# Patient Record
Sex: Female | Born: 1990 | Race: White | Hispanic: No | Marital: Married
Health system: Southern US, Community
[De-identification: ages and names within clinical notes are randomized; demographics above are authoritative.]

## PROBLEM LIST (undated history)

## (undated) DIAGNOSIS — I82409 Acute embolism and thrombosis of unspecified deep veins of unspecified lower extremity: Secondary | ICD-10-CM

## (undated) DIAGNOSIS — M1990 Unspecified osteoarthritis, unspecified site: Secondary | ICD-10-CM

## (undated) DIAGNOSIS — T781XXA Other adverse food reactions, not elsewhere classified, initial encounter: Secondary | ICD-10-CM

## (undated) DIAGNOSIS — J45909 Unspecified asthma, uncomplicated: Secondary | ICD-10-CM

## (undated) DIAGNOSIS — L0291 Cutaneous abscess, unspecified: Secondary | ICD-10-CM

---

## 2019-10-15 ENCOUNTER — Emergency Department (HOSPITAL_COMMUNITY)
Admission: EM | Admit: 2019-10-15 | Discharge: 2019-10-16 | Disposition: A | Discharge status: Home / Self Care | Payer: Medicaid - Out of State | Attending: Emergency Medicine | Admitting: Emergency Medicine

## 2019-10-15 ENCOUNTER — Other Ambulatory Visit: Payer: Self-pay

## 2019-10-15 ENCOUNTER — Emergency Department (HOSPITAL_COMMUNITY): Payer: Medicaid - Out of State

## 2019-10-15 ENCOUNTER — Encounter (HOSPITAL_COMMUNITY): Payer: Self-pay | Admitting: Emergency Medicine

## 2019-10-15 DIAGNOSIS — J45909 Unspecified asthma, uncomplicated: Secondary | ICD-10-CM | POA: Diagnosis not present

## 2019-10-15 DIAGNOSIS — N3090 Cystitis, unspecified without hematuria: Secondary | ICD-10-CM

## 2019-10-15 DIAGNOSIS — R1030 Lower abdominal pain, unspecified: Secondary | ICD-10-CM | POA: Diagnosis present

## 2019-10-15 DIAGNOSIS — N3000 Acute cystitis without hematuria: Secondary | ICD-10-CM | POA: Diagnosis not present

## 2019-10-15 DIAGNOSIS — I880 Nonspecific mesenteric lymphadenitis: Secondary | ICD-10-CM | POA: Insufficient documentation

## 2019-10-15 DIAGNOSIS — Z79899 Other long term (current) drug therapy: Secondary | ICD-10-CM | POA: Diagnosis not present

## 2019-10-15 HISTORY — DX: Other adverse food reactions, not elsewhere classified, initial encounter: T78.1XXA

## 2019-10-15 HISTORY — DX: Acute embolism and thrombosis of unspecified deep veins of unspecified lower extremity: I82.409

## 2019-10-15 HISTORY — DX: Unspecified osteoarthritis, unspecified site: M19.90

## 2019-10-15 HISTORY — DX: Cutaneous abscess, unspecified: L02.91

## 2019-10-15 HISTORY — DX: Unspecified asthma, uncomplicated: J45.909

## 2019-10-15 LAB — COMPREHENSIVE METABOLIC PANEL
ALT: 19 U/L (ref 0–44)
AST: 16 U/L (ref 15–41)
Albumin: 3.5 g/dL (ref 3.5–5.0)
Alkaline Phosphatase: 60 U/L (ref 38–126)
Anion gap: 9 (ref 5–15)
BUN: 10 mg/dL (ref 6–20)
CO2: 22 mmol/L (ref 22–32)
Calcium: 8.4 mg/dL — ABNORMAL LOW (ref 8.9–10.3)
Chloride: 107 mmol/L (ref 98–111)
Creatinine, Ser: 0.79 mg/dL (ref 0.44–1.00)
GFR calc Af Amer: >60 mL/min (ref >60)
GFR calc non Af Amer: >60 mL/min (ref >60)
Glucose, Bld: 86 mg/dL (ref 70–99)
Potassium: 3.8 mmol/L (ref 3.5–5.1)
Sodium: 138 mmol/L (ref 135–145)
Total Bilirubin: 0.3 mg/dL (ref 0.3–1.2)
Total Protein: 7.6 g/dL (ref 6.5–8.1)

## 2019-10-15 LAB — URINALYSIS, ROUTINE W REFLEX MICROSCOPIC
Bilirubin Urine: NEGATIVE
Glucose, UA: NEGATIVE mg/dL
Ketones, ur: NEGATIVE mg/dL
Nitrite: NEGATIVE
Protein, ur: 100 mg/dL — AB
Specific Gravity, Urine: 1.03 (ref 1.005–1.030)
pH: 6 (ref 5.0–8.0)

## 2019-10-15 LAB — CBC WITH DIFFERENTIAL/PLATELET
Abs Immature Granulocytes: 0.06 10*3/uL (ref 0.00–0.07)
Basophils Absolute: 0 10*3/uL (ref 0.0–0.1)
Basophils Relative: 0 %
Eosinophils Absolute: 0.1 10*3/uL (ref 0.0–0.5)
Eosinophils Relative: 1 %
HCT: 35.3 % — ABNORMAL LOW (ref 36.0–46.0)
Hemoglobin: 10.9 g/dL — ABNORMAL LOW (ref 12.0–15.0)
Immature Granulocytes: 1 %
Lymphocytes Relative: 27 %
Lymphs Abs: 3.3 10*3/uL (ref 0.7–4.0)
MCH: 27.3 pg (ref 26.0–34.0)
MCHC: 30.9 g/dL (ref 30.0–36.0)
MCV: 88.5 fL (ref 80.0–100.0)
Monocytes Absolute: 0.7 10*3/uL (ref 0.1–1.0)
Monocytes Relative: 6 %
Neutro Abs: 7.8 10*3/uL — ABNORMAL HIGH (ref 1.7–7.7)
Neutrophils Relative %: 65 %
Platelets: 364 10*3/uL (ref 150–400)
RBC: 3.99 MIL/uL (ref 3.87–5.11)
RDW: 13.2 % (ref 11.5–15.5)
WBC: 11.9 10*3/uL — ABNORMAL HIGH (ref 4.0–10.5)
nRBC: 0 % (ref 0.0–0.2)

## 2019-10-15 LAB — PREGNANCY, URINE: Preg Test, Ur: NEGATIVE

## 2019-10-15 LAB — CBG MONITORING, ED: Glucose-Capillary: 90 mg/dL (ref 70–99)

## 2019-10-15 LAB — LIPASE, BLOOD: Lipase: 25 U/L (ref 11–51)

## 2019-10-15 MED ORDER — ONDANSETRON 8 MG PO TBDP
8.0000 mg | ORAL_TABLET | Freq: Once | ORAL | Status: AC
  Administered 2019-10-15: 8 mg via ORAL
  Filled 2019-10-15: qty 1

## 2019-10-15 MED ORDER — KETOROLAC TROMETHAMINE 30 MG/ML IJ SOLN
15.0000 mg | Freq: Once | INTRAMUSCULAR | Status: DC
  Filled 2019-10-15: qty 1

## 2019-10-15 MED ORDER — SODIUM CHLORIDE 0.9 % IV BOLUS: 1000.0000 mL | Freq: Once | INTRAVENOUS | Status: DC

## 2019-10-15 MED ORDER — IBUPROFEN 400 MG PO TABS
600.0000 mg | ORAL_TABLET | Freq: Once | ORAL | Status: AC
  Administered 2019-10-15: 600 mg via ORAL
  Filled 2019-10-15: qty 2

## 2019-10-15 NOTE — ED Provider Notes (Signed)
[facility] EMERGENCY DEPARTMENT Provider Note   CSN: [id number] Arrival date & time: 10/15/19  1931     History   Chief Complaint Chief Complaint  Patient presents with  . Abdominal Pain    back pain    HPI Jessica Rodriguez is a 28 y.o. female.     HPI   Jessica Rodriguez is a 28 y.o. female, with a history of arthritis, asthma, DVT, presenting to the ED with abdominal pain beginning around 5 pm today. Pain is suprapubic, burning, constant, improved since onset, currently 8/10, radiating bilateral lower abdomen. Also has a soreness in the left midback. Began while driving. Accompanied by nausea which has since resolved.  Subjective fever. Preceded by a sense of spasms in her bladder intermittently for several days and has had the same symptoms with UTIs in the past.  Last bowel movement was just prior to arrival.  This did not improve symptoms. Started on clarithromycin two days ago for sinusitis, 500mg  q12 hours for 10 days.  Took ibuprofen around 2 pm today for her sinus pain. Has not taken any other medications since her abdominal pain began.   LMP began around Nov 8. She has a menstrual cycle about every 3 months.  Denies any suspicion for STDs. Denies vomiting, diarrhea, constipation, chest pain, cough, shortness of breath, vaginal discharge/bleeding, dysuria, hematuria, or any other complaints.  Past Medical History:  Diagnosis Date  . Abscess   . Allergic reaction to alpha-gal   . Arthritis   . Asthma   . DVT (deep venous thrombosis) (HCC)     There are no active problems to display for this patient.   History reviewed. No pertinent surgical history.   OB History   No obstetric history on file.      Home Medications    Prior to Admission medications   Medication Sig Start Date End Date Taking? Authorizing Provider  albuterol (PROVENTIL HFA) 108 (90 Base) MCG/ACT inhaler Inhale into the lungs.   Yes [provider]  albuterol (PROVENTIL) (2.5 MG/3ML) 0.083%  nebulizer solution Inhale into the lungs.   Yes [provider]  amLODipine (NORVASC) 2.5 MG tablet Take 2.5 mg by mouth daily. 09/01/19  Yes [provider]  ASHLYNA 0.15-0.03 &0.01 MG tablet Take 1 tablet by mouth daily. 4pm 09/06/19  Yes [provider]  cetirizine (ZYRTEC) 10 MG tablet Take 10 mg by mouth daily. 10/01/19  Yes [provider]  cyanocobalamin (,VITAMIN B-12,) 1000 MCG/ML injection Inject 1,000 mcg into the muscle every 30 (thirty) days.  09/17/19  Yes [provider]  fluticasone (FLONASE) 50 MCG/ACT nasal spray Place 2 sprays into both nostrils daily as needed for allergies or rhinitis.   Yes [provider]  hydrochlorothiazide (HYDRODIURIL) 12.5 MG tablet Take 12.5 mg by mouth daily. 10/03/19  Yes [provider]  HYDROcodone-acetaminophen (NORCO/VICODIN) 5-325 MG tablet Take 1 tablet by mouth every 4 (four) hours as needed for moderate pain or severe pain.  10/01/19  Yes [provider]  ibuprofen (ADVIL) 200 MG tablet Take 800 mg by mouth every 6 (six) hours as needed for fever or moderate pain.   Yes [provider]  omeprazole (PRILOSEC) 40 MG capsule Take 40 mg by mouth every morning. 10/13/19  Yes [provider]  potassium chloride SA (KLOR-CON) 20 MEQ tablet Take 40 mEq by mouth daily. 09/17/19  Yes [provider]  Vitamin D, Ergocalciferol, (DRISDOL) 1.25 MG (50000 UT) CAPS capsule Take 50,000 Units by mouth  every Monday.  06/11/19  Yes [provider]  ciprofloxacin (CIPRO) 500 MG tablet Take 1 tablet (500 mg total) by mouth 2 (two) times daily for 10 days. 10/16/19 10/26/19  Joy, Shawn C, PA-C  EPINEPHrine 0.3 mg/0.3 mL IJ SOAJ injection Inject 0.3 mg into the muscle as needed for anaphylaxis.  06/11/19   [provider]    Family History No family history on file.  Social History Social History   Tobacco Use  . Smoking status: Not on file   Substance Use Topics  . Alcohol use: Not Currently  . Drug use: Never     Allergies   Alpha-gal, Iodinated diagnostic agents, Lactase, Lactose, Nitrofurantoin, Other, Penicillins, and Sulfa antibiotics   Review of Systems Review of Systems  Constitutional: Positive for fever.  Respiratory: Negative for cough and shortness of breath.   Cardiovascular: Negative for chest pain.  Gastrointestinal: Positive for abdominal pain and nausea. Negative for blood in stool, constipation, diarrhea and vomiting.  Genitourinary: Negative for decreased urine volume, difficulty urinating, dysuria, flank pain, hematuria, vaginal bleeding and vaginal discharge.  Musculoskeletal: Positive for back pain.  Neurological: Negative for weakness.  All other systems reviewed and are negative.    Physical Exam Updated Vital Signs BP 123/73 (BP Location: Right Arm)   Pulse (!) 106   Temp 98 F (36.7 C) (Oral)   Resp 20   Ht 5\' 4"  (1.626 m)   Wt 127 kg   LMP 10/06/2019 (Approximate)   SpO2 100%   BMI 48.06 kg/m   Physical Exam Vitals signs and nursing note reviewed.  Constitutional:      General: She is not in acute distress.    Appearance: She is well-developed. She is obese. She is not diaphoretic.     Comments: Patient is actually rather comfortable appearing.  Sitting upright in bed in no apparent distress.  HENT:     Head: Normocephalic and atraumatic.     Mouth/Throat:     Mouth: Mucous membranes are moist.     Pharynx: Oropharynx is clear.  Eyes:     Conjunctiva/sclera: Conjunctivae normal.  Neck:     Musculoskeletal: Neck supple.  Cardiovascular:     Rate and Rhythm: Normal rate and regular rhythm.     Pulses: Normal pulses.          Radial pulses are 2+ on the right side and 2+ on the left side.     Heart sounds: Normal heart sounds.     Comments: No tachycardia during my exam. Pulmonary:     Effort: Pulmonary effort is normal. No respiratory distress.     Breath sounds: Normal  breath sounds.  Abdominal:     Palpations: Abdomen is soft.     Tenderness: There is abdominal tenderness in the suprapubic area. There is no right CVA tenderness or guarding.       Comments: Tenderness is worst in the suprapubic region. There is some tenderness in the left mid back as well which could be interpreted as mild CVA tenderness, however, there is not increased pain with percussion.  Musculoskeletal:     Right lower leg: No edema.     Left lower leg: No edema.  Lymphadenopathy:     Cervical: No cervical adenopathy.  Skin:    General: Skin is warm and dry.  Neurological:     Mental Status: She is alert.  Psychiatric:        Mood and Affect: Mood and affect normal.  Speech: Speech normal.        Behavior: Behavior normal.      ED Treatments / Results  Labs (all labs ordered are listed, but only abnormal results are displayed) Labs Reviewed  URINALYSIS, ROUTINE W REFLEX MICROSCOPIC - Abnormal; Notable for the following components:      Result Value   APPearance CLOUDY (*)    Hgb urine dipstick SMALL (*)    Protein, ur 100 (*)    Leukocytes,Ua SMALL (*)    Bacteria, UA MANY (*)    All other components within normal limits  COMPREHENSIVE METABOLIC PANEL - Abnormal; Notable for the following components:   Calcium 8.4 (*)    All other components within normal limits  CBC WITH DIFFERENTIAL/PLATELET - Abnormal; Notable for the following components:   WBC 11.9 (*)    Hemoglobin 10.9 (*)    HCT 35.3 (*)    Neutro Abs 7.8 (*)    All other components within normal limits  URINE CULTURE  PREGNANCY, URINE  LIPASE, BLOOD  CBG MONITORING, ED    EKG None  Radiology Ct Abdomen Pelvis Wo Contrast  Result Date: 10/16/2019 CLINICAL DATA:  Abdominal pain, burning in her stomach, not upon urination, aching in the back EXAM: CT ABDOMEN AND PELVIS WITHOUT CONTRAST TECHNIQUE: Multidetector CT imaging of the abdomen and pelvis was performed following the standard  protocol without IV contrast. COMPARISON:  None. FINDINGS: Lower chest: Lung bases are clear. Normal heart size. No pericardial effusion. Hepatobiliary: No focal liver abnormality is seen. No gallstones, gallbladder wall thickening, or biliary dilatation. Pancreas: Unremarkable. No pancreatic ductal dilatation or surrounding inflammatory changes. Spleen: Normal in size without focal abnormality. Adrenals/Urinary Tract: Normal adrenal glands. No visible or contour deforming renal masses. Hyperattenuation of the renal medullary tips, a nonspecific finding but can be seen with higher urine specific gravity/urine concentration. No hydronephrosis. No visible urolithiasis. Urinary bladder is largely decompressed at the time of exam and therefore poorly evaluated by CT imaging. Stomach/Bowel: Distal esophagus, stomach and duodenal sweep are unremarkable. No small bowel wall thickening or dilatation. A normal appendix is visualized. No colonic dilatation or wall thickening. Enteric contrast media traverses to the distal descending colon. No evidence of obstruction. Vascular/Lymphatic: The aorta is normal caliber. There is a focal cluster reactive appearing mesenteric lymph nodes in the left upper quadrant (2/44), as well as a similar cluster in the right lower quadrant (2/54). Reproductive: Normal uterus. No concerning adnexal lesions. Other: No abdominopelvic free fluid or free gas. No bowel containing hernias. Mild body wall edema. Musculoskeletal: No acute osseous abnormality or suspicious osseous lesion. IMPRESSION: 1. Focal cluster reactive appearing mesenteric lymph nodes in the left upper quadrant and right lower quadrant, suggestive of mesenteric adenitis. 2. Hyperattenuation of the renal medullary tips, a nonspecific finding but can be seen with higher urine specific gravity/urine concentration. 3. No other acute intra-abdominal process. Electronically Signed   By: Kreg Shropshire M.D.   On: 10/16/2019 00:29     Procedures Procedures (including critical care time)  Medications Ordered in ED Medications  ibuprofen (ADVIL) tablet 600 mg (600 mg Oral Given 10/15/19 2231)  ondansetron (ZOFRAN-ODT) disintegrating tablet 8 mg (8 mg Oral Given 10/15/19 2246)  ciprofloxacin (CIPRO) tablet 500 mg (500 mg Oral Given 10/16/19 0207)     Initial Impression / Assessment and Plan / ED Course  I have reviewed the triage vital signs and the nursing notes.  Pertinent labs & imaging results that were available during my care of the  patient were reviewed by me and considered in my medical decision making (see chart for details).  Clinical Course as of Oct 16 914  Wed Oct 15, 2019  2149 IV attempts have failed.  Patient is refused further IV attempts.  States she would prefer p.o. medications.   [SJ]  Thu Oct 16, 2019  0137 Spoke with Santiago Glad, pharmacist.  States patient can stop her clarithromycin and only take the cipro, as the Cipro will provide coverage for sinusitis as well as cystitis or pyelonephritis.   [SJ]    Clinical Course User Index [SJ] Joy, Shawn C, PA-C       Patient presents with abdominal pain beginning today. Patient is nontoxic appearing, afebrile, not tachycardic on my exam, not tachypneic, not hypotensive, maintains excellent SPO2 on room air, and is in no apparent distress.  Discussed pelvic exam, however, patient states she has a good relationship with her OB/GYN and can have this performed in their office, if needed. Mild leukocytosis. CT with evidence of mesenteric adenitis. Due to the patient stating that some of her symptoms today have previously been associated with UTI, cystitis versus pyelonephritis (due to the addition of back pain, leukocytosis, and subjective fever) was entertained as a diagnosis and treatment was provided.  Urine culture pending. The patient was given instructions for home care as well as return precautions. Patient voices understanding of these  instructions, accepts the plan, and is comfortable with discharge.   Vitals:   10/15/19 1954 10/15/19 1955 10/16/19 0225  BP: 123/73  (!) 104/56  Pulse: (!) 106  95  Resp: 20  20  Temp: 98 F (36.7 C)    TempSrc: Oral    SpO2: 100%  99%  Weight:  127 kg   Height:  5\' 4"  (1.626 m)     Final Clinical Impressions(s) / ED Diagnoses   Final diagnoses:  Mesenteric adenitis  Cystitis    ED Discharge Orders         Ordered    ciprofloxacin (CIPRO) 500 MG tablet  2 times daily     10/16/19 0118           Lorayne Bender, PA-C 10/17/19 9379    Ezequiel Essex, MD 10/17/19 0930

## 2019-10-15 NOTE — ED Notes (Signed)
Patient is currently taking Clarithromycin for a sinus infection.

## 2019-10-15 NOTE — ED Triage Notes (Signed)
Patient complaining of abdominal pain. Patient states burning in her stomach but not upon urination. Patient states dull aching pain in her back.

## 2019-10-16 MED ORDER — CIPROFLOXACIN HCL 250 MG PO TABS
500.0000 mg | ORAL_TABLET | Freq: Once | ORAL | Status: AC
  Administered 2019-10-16: 02:00:00 500 mg via ORAL
  Filled 2019-10-16: qty 2

## 2019-10-16 MED ORDER — CIPROFLOXACIN HCL 500 MG PO TABS: 500.0000 mg | ORAL_TABLET | Freq: Two times a day (BID) | ORAL | 0 refills | Status: AC

## 2019-10-16 NOTE — Discharge Instructions (Addendum)
Antiinflammatory medications: Take 600 mg of ibuprofen every 6 hours or 440 mg (over the counter dose) to 500 mg (prescription dose) of naproxen every 12 hours for the next 3 days. After this time, these medications may be used as needed for pain. Take these medications with food to avoid upset stomach. Choose only one of these medications, do not take them together. Acetaminophen (generic for Tylenol): Should you continue to have additional pain while taking the ibuprofen or naproxen, you may add in acetaminophen as needed. Your daily total maximum amount of acetaminophen from all sources should be limited to 4000mg /day for persons without liver problems, or 2000mg /day for those with liver problems.  You may stop taking the clarithromycin and begin taking the ciprofloxacin.  This will cover sinus infection and UTI.  Please take all of your antibiotics until finished!   You may develop abdominal discomfort or diarrhea from the antibiotic.  You may help offset this with probiotics which you can buy or get in yogurt. Do not eat or take the probiotics until 2 hours after your antibiotic.   Follow-up with your primary care provider for any further management of this issue.  It is also advisable for you to inform your surgeon of the mesenteric adenitis diagnosis.

## 2019-10-17 LAB — URINE CULTURE

## 2020-04-15 IMAGING — CT CT ABD-PELV W/O CM
2 of 4 series · 16 of 46 positions shown, 18 images · non-contrast
Comparison: None.

CLINICAL DATA: Abdominal pain, burning in her stomach, not upon
urination, aching in the back

EXAM:
CT ABDOMEN AND PELVIS WITHOUT CONTRAST
TECHNIQUE: Multidetector CT imaging of the abdomen and pelvis was performed
following the standard protocol without IV contrast.

[Series 2: axial st · axial · 0.98mm/px · z∈[-15,+390]mm · 13 of 93 slices shown, 15 images]
[im 6/93  soft-tissue]
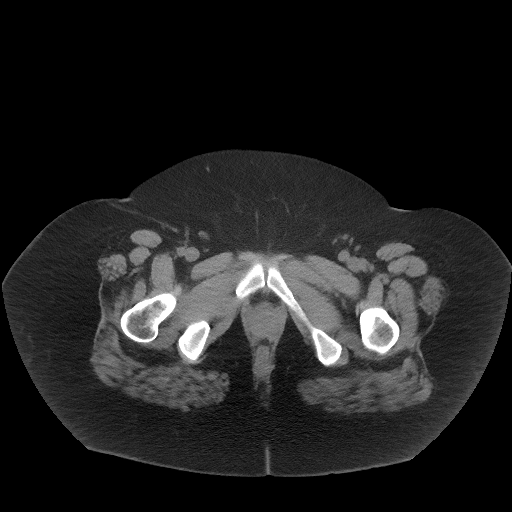
[im 6/93  bone]
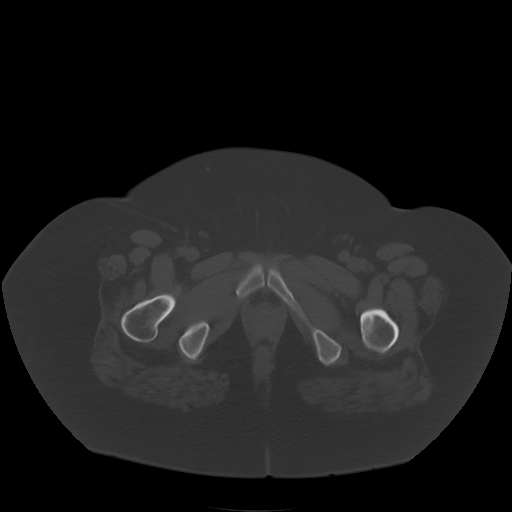
[im 11/93  soft-tissue]
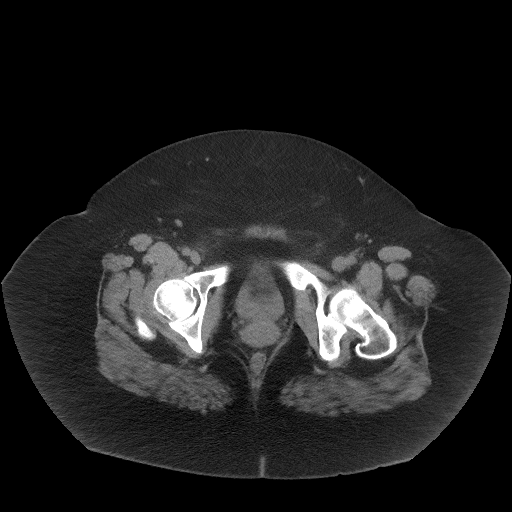
[im 22/93  soft-tissue]
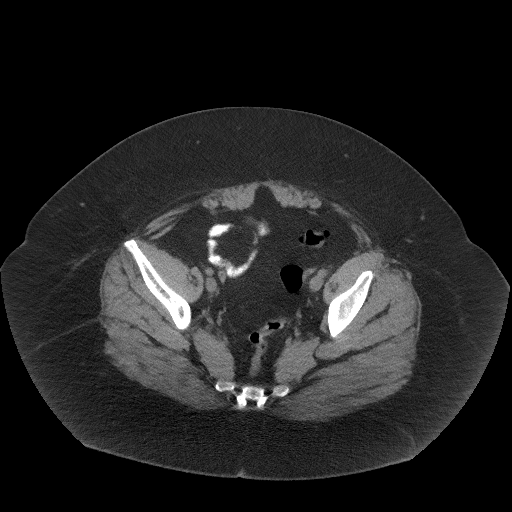
[im 28/93  soft-tissue]
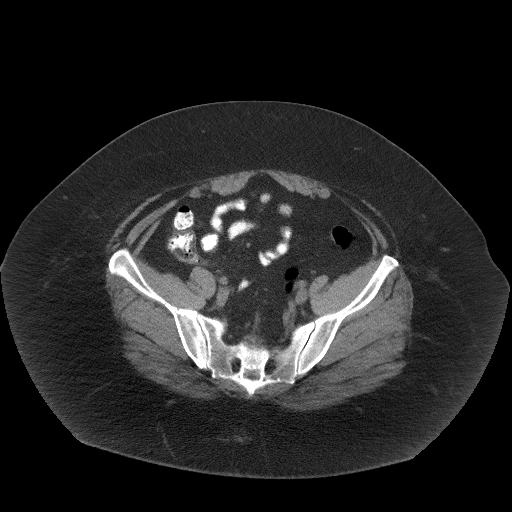
[im 33/93  soft-tissue]
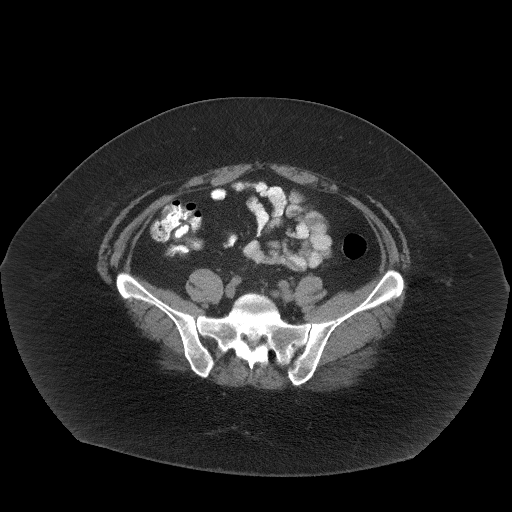
[im 38/93  soft-tissue]
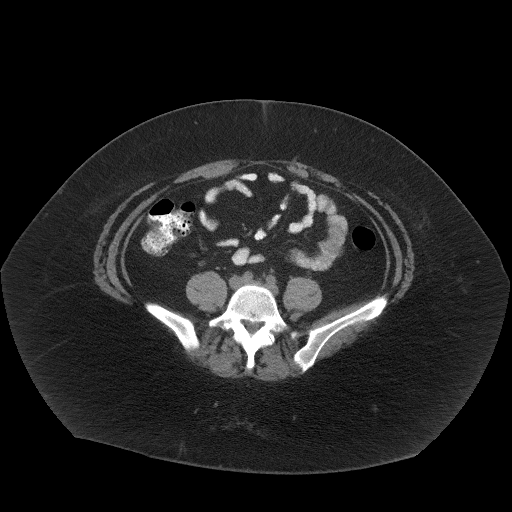
[im 49/93  soft-tissue]
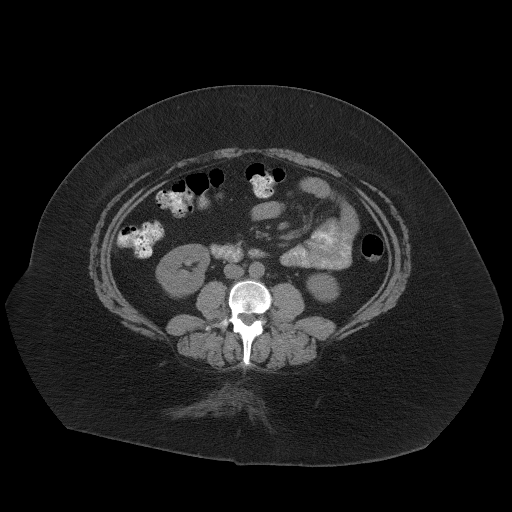
[im 55/93  soft-tissue]
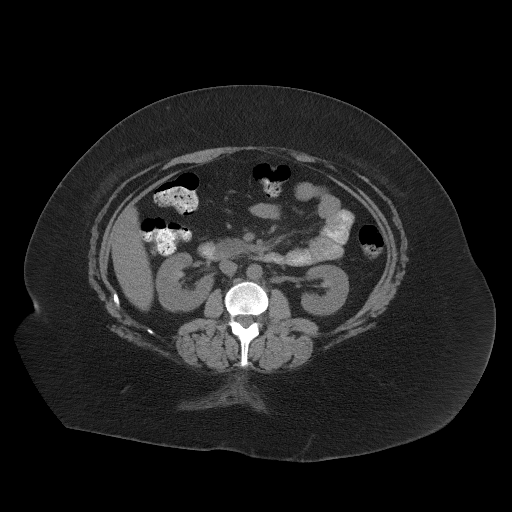
[im 60/93  soft-tissue]
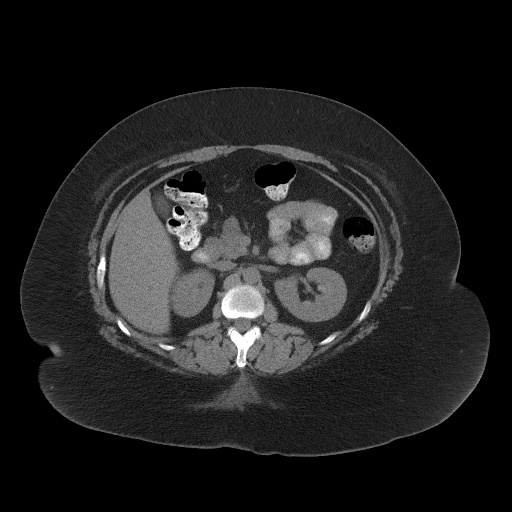
[im 60/93  bone]
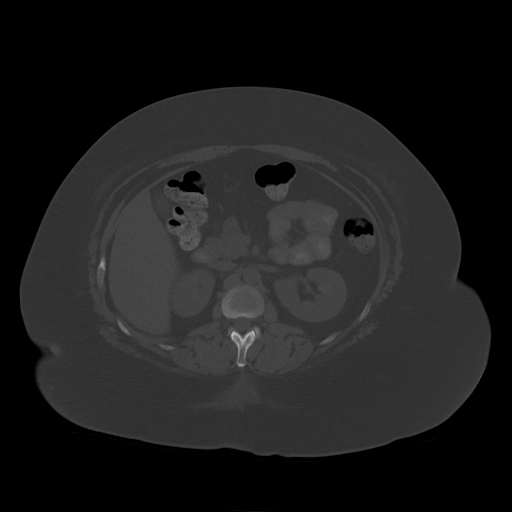
[im 65/93  soft-tissue]
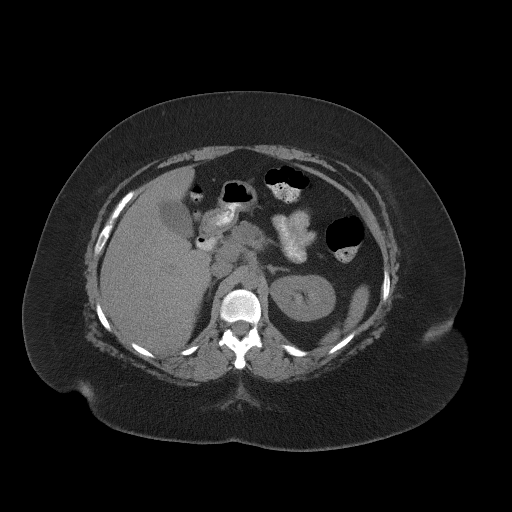
[im 71/93  soft-tissue]
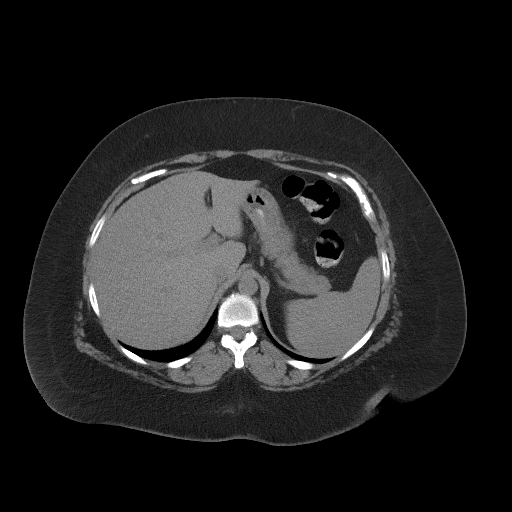
[im 82/93  soft-tissue]
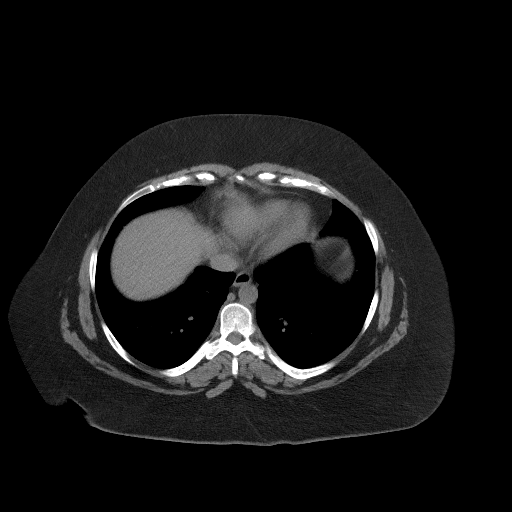
[im 87/93  soft-tissue]
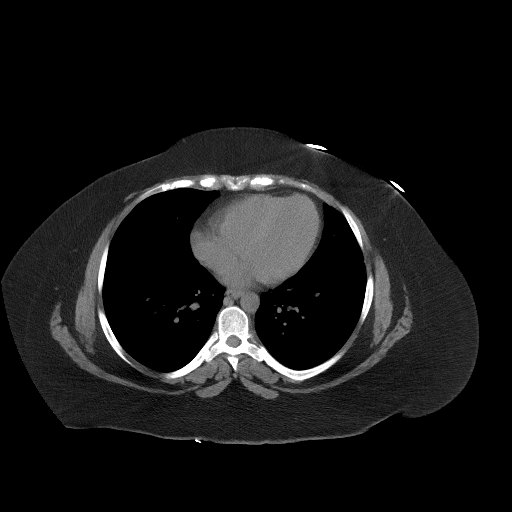

[Series 5: coronal st · coronal · 0.90mm/px · 3 of 102 slices shown]
[im 34/102  soft-tissue]
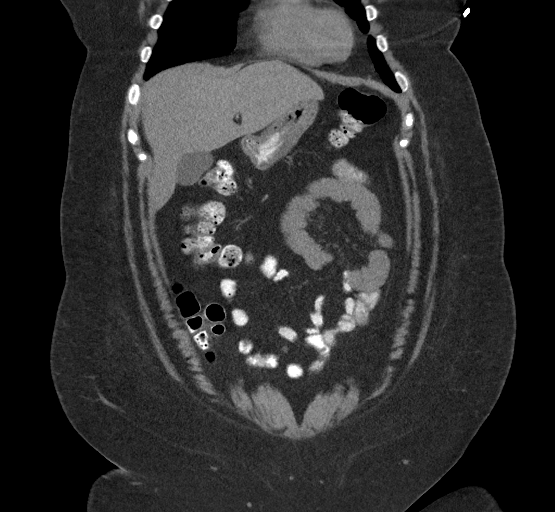
[im 45/102  soft-tissue]
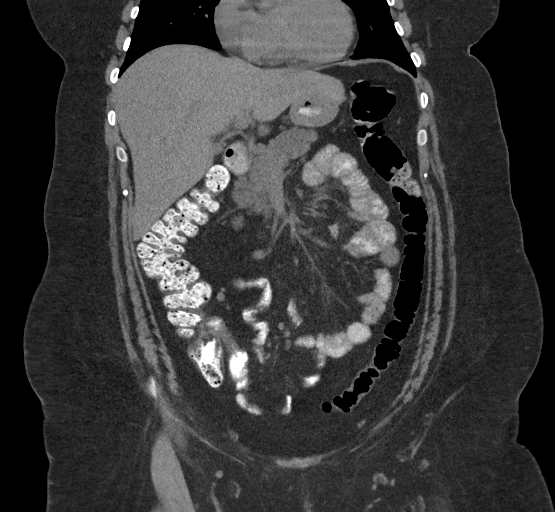
[im 57/102  soft-tissue]
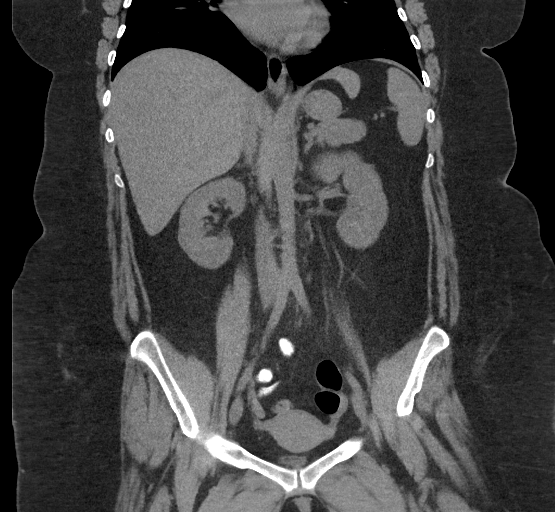

[16 of 46 positions shown; findings below may reference images not displayed]

FINDINGS: Lower chest: Lung bases are clear. Normal heart size. No pericardial
effusion.

Hepatobiliary: No focal liver abnormality is seen. No gallstones,
gallbladder wall thickening, or biliary dilatation.

Pancreas: Unremarkable. No pancreatic ductal dilatation or
surrounding inflammatory changes.

Spleen: Normal in size without focal abnormality.

Adrenals/Urinary Tract: Normal adrenal glands. No visible or contour
deforming renal masses. Hyperattenuation of the renal medullary
tips, a nonspecific finding but can be seen with higher urine
specific gravity/urine concentration. No hydronephrosis. No visible
urolithiasis. Urinary bladder is largely decompressed at the time of
exam and therefore poorly evaluated by CT imaging.

Stomach/Bowel: Distal esophagus, stomach and duodenal sweep are
unremarkable. No small bowel wall thickening or dilatation. A normal
appendix is visualized. No colonic dilatation or wall thickening.
Enteric contrast media traverses to the distal descending colon. No
evidence of obstruction.

Vascular/Lymphatic: The aorta is normal caliber. There is a focal
cluster reactive appearing mesenteric lymph nodes in the left upper
quadrant (2/44), as well as a similar cluster in the right lower
quadrant (2/54).

Reproductive: Normal uterus. No concerning adnexal lesions.

Other: No abdominopelvic free fluid or free gas. No bowel containing
hernias. Mild body wall edema.

Musculoskeletal: No acute osseous abnormality or suspicious osseous
lesion.
IMPRESSION: 1. Focal cluster reactive appearing mesenteric lymph nodes in the
left upper quadrant and right lower quadrant, suggestive of
mesenteric adenitis.
2. Hyperattenuation of the renal medullary tips, a nonspecific
finding but can be seen with higher urine specific gravity/urine
concentration.
3. No other acute intra-abdominal process.
